# Patient Record
Sex: Male | Born: 1950 | Race: White | Hispanic: No | Marital: Married | Smoking: Never smoker
Health system: Southern US, Community
[De-identification: ages and names within clinical notes are randomized; demographics above are authoritative.]

## PROBLEM LIST (undated history)

## (undated) DIAGNOSIS — I2510 Atherosclerotic heart disease of native coronary artery without angina pectoris: Secondary | ICD-10-CM

## (undated) DIAGNOSIS — R7301 Impaired fasting glucose: Secondary | ICD-10-CM

## (undated) DIAGNOSIS — E785 Hyperlipidemia, unspecified: Secondary | ICD-10-CM

## (undated) HISTORY — DX: Hyperlipidemia, unspecified: E78.5

## (undated) HISTORY — DX: Atherosclerotic heart disease of native coronary artery without angina pectoris: I25.10

## (undated) HISTORY — DX: Impaired fasting glucose: R73.01

---

## 1997-11-12 HISTORY — PX: APPENDECTOMY: SHX54

## 2002-11-12 HISTORY — PX: CARDIAC CATHETERIZATION: SHX172

## 2014-01-29 HISTORY — PX: CORONARY ARTERY BYPASS GRAFT: SHX141

## 2015-10-12 DIAGNOSIS — Z951 Presence of aortocoronary bypass graft: Secondary | ICD-10-CM | POA: Insufficient documentation

## 2015-10-12 DIAGNOSIS — R7301 Impaired fasting glucose: Secondary | ICD-10-CM | POA: Insufficient documentation

## 2015-10-12 DIAGNOSIS — E785 Hyperlipidemia, unspecified: Secondary | ICD-10-CM

## 2015-10-12 HISTORY — DX: Impaired fasting glucose: R73.01

## 2015-10-12 HISTORY — DX: Hyperlipidemia, unspecified: E78.5

## 2015-10-14 ENCOUNTER — Encounter: Payer: Self-pay | Admitting: Cardiovascular Disease

## 2015-10-14 ENCOUNTER — Ambulatory Visit (INDEPENDENT_AMBULATORY_CARE_PROVIDER_SITE_OTHER): Payer: Managed Care, Other (non HMO) | Admitting: Cardiovascular Disease

## 2015-10-14 VITALS — BP 118/82 | HR 66 | Ht 69.0 in | Wt 210.8 lb

## 2015-10-14 DIAGNOSIS — I2510 Atherosclerotic heart disease of native coronary artery without angina pectoris: Secondary | ICD-10-CM | POA: Diagnosis not present

## 2015-10-14 DIAGNOSIS — E785 Hyperlipidemia, unspecified: Secondary | ICD-10-CM | POA: Diagnosis not present

## 2015-10-14 DIAGNOSIS — Z951 Presence of aortocoronary bypass graft: Secondary | ICD-10-CM | POA: Diagnosis not present

## 2015-10-14 MED ORDER — SIMVASTATIN 20 MG PO TABS: 20.0000 mg | ORAL_TABLET | Freq: Every day | ORAL | Status: DC

## 2015-10-14 MED ORDER — CARVEDILOL 3.125 MG PO TABS: 3.1250 mg | ORAL_TABLET | Freq: Two times a day (BID) | ORAL | Status: DC

## 2015-10-14 NOTE — Patient Instructions (Signed)

## 2015-10-14 NOTE — Progress Notes (Signed)
Cardiology Office Note   Date:  10/14/2015   ID:  Steve Craig, DOB [birth year], MRN [id number]  PCP:  Neldon Labella, MD  Cardiologist:   Vesta Mixer, MD   Chief Complaint  Patient presents with  . Coronary Artery Disease   Problem List 1. CAD - CABG 2. Hyperlipidemia    History of Present Illness: Steve Craig is a 64 y.o. male who presents for CAD Hx of CABG January 24, 2014.   Feels great .  Has tried Atorvastatin in the past - did not work as well as he would had hoped. Eats a fairly good diet    Works as a Hospital doctor for [employer]   Works out several times a week .  Worked as an Charity fundraiser Non smoker, occasional ETOH.   Past Medical History  Diagnosis Date  . Hyperlipidemia   . Coronary artery disease   . Elevated fasting glucose 10/12/2015  . Hyperlipemia 10/12/2015    Past Surgical History  Procedure Laterality Date  . Coronary artery bypass graft  01/29/2014  . Cardiac catheterization  2004  . Appendectomy  1999     Current Outpatient Prescriptions  Medication Sig Dispense Refill  . aspirin 81 MG tablet Take 81 mg by mouth daily.    . carvedilol (COREG) 3.125 MG tablet Take 3.125 mg by mouth 2 (two) times daily with a meal.    . simvastatin (ZOCOR) 20 MG tablet Take 20 mg by mouth daily.     No current facility-administered medications for this visit.    Allergies:   Metoprolol succinate er and Morphine sulfate    Social History:  The patient  reports that he has never smoked. He does not have any smokeless tobacco history on file. He reports that he drinks alcohol. He reports that he does not use illicit drugs.   Family History:  The patient's family history includes Breast cancer in his mother; Coronary artery disease in his father; Diabetes Mellitus I in his brother.    ROS:  Please see the history of present illness.    Review of Systems: Constitutional:  denies fever, chills, diaphoresis, appetite change and fatigue.  HEENT:  denies photophobia, eye pain, redness, hearing loss, ear pain, congestion, sore throat, rhinorrhea, sneezing, neck pain, neck stiffness and tinnitus.  Respiratory: denies SOB, DOE, cough, chest tightness, and wheezing.  Cardiovascular: denies chest pain, palpitations and leg swelling.  Gastrointestinal: denies nausea, vomiting, abdominal pain, diarrhea, constipation, blood in stool.  Genitourinary: denies dysuria, urgency, frequency, hematuria, flank pain and difficulty urinating.  Musculoskeletal: denies  myalgias, back pain, joint swelling, arthralgias and gait problem.   Skin: denies pallor, rash and wound.  Neurological: denies dizziness, seizures, syncope, weakness, light-headedness, numbness and headaches.   Hematological: denies adenopathy, easy bruising, personal or family bleeding history.  Psychiatric/ Behavioral: denies suicidal ideation, mood changes, confusion, nervousness, sleep disturbance and agitation.       All other systems are reviewed and negative.    PHYSICAL EXAM: VS:  BP 118/82 mmHg  Pulse 66  Ht  (1.753 m)  Wt 210 lb 12.8 oz (95.618 kg)  BMI 31.12 kg/m2 , BMI Body mass index is 31.12 kg/(m^2). GEN: Well nourished, well developed, in no acute distress HEENT: normal Neck: no JVD, carotid bruits, or masses Cardiac: RRR; no murmurs, rubs, or gallops,no edema  Respiratory:  clear to auscultation bilaterally, normal work of breathing GI: soft, nontender, nondistended, + BS MS: no deformity or atrophy Skin: warm and dry,  no rash Neuro:  Strength and sensation are intact Psych: normal   EKG:  EKG is ordered today. The ekg ordered today demonstrates  NSR at 66.  Normal ECG    Recent Labs: No results found for requested labs within last 365 days.    Lipid Panel No results found for: CHOL, TRIG, HDL, CHOLHDL, VLDL, LDLCALC, LDLDIRECT    Wt Readings from Last 3 Encounters:  10/14/15 210 lb 12.8 oz (95.618 kg)      Other studies  Reviewed: Additional studies/ records that were reviewed today include: . Review of the above records demonstrates:    ASSESSMENT AND PLAN:  1.  CAD - s/p CABG in March, 2015.   No angina.   Very active.    2. Hyperlipidemia:  Continue simvastatin  His last LDL was 86.  We discussed changing to crestor next year.   I'll see him in 1 year for OV and fasting labs.     Current medicines are reviewed at length with the patient today.  The patient does not have concerns regarding medicines.  The following changes have been made:  no change  Labs/ tests ordered today include:  No orders of the defined types were placed in this encounter.     Disposition:   FU with me in 1 year.      Tiarra Anastacio, Deloris PingPhilip J, MD  10/14/2015 10:06 AM    [facility] [address], [place], [place]  [address] Phone: [phone](336) [phone]; Fax: [phone]   [facility]  [address] [place], [place]  [address] [phone]   Fax [phone]) [phone]

## 2016-02-04 ENCOUNTER — Other Ambulatory Visit: Payer: Self-pay | Admitting: Cardiovascular Disease

## 2016-03-05 ENCOUNTER — Encounter: Payer: Self-pay | Admitting: Cardiovascular Disease

## 2016-03-05 ENCOUNTER — Telehealth: Payer: Self-pay | Admitting: Cardiovascular Disease

## 2016-03-05 NOTE — Telephone Encounter (Signed)
New Message  Pt returned call. Checked with the cost of Crestor found that it was more expensive than Simvastatin will stay simvastatin at this time. Please call back to discuss

## 2016-03-05 NOTE — Telephone Encounter (Signed)
New message    Pt is calling to ask if Dr.Nasher going to change is medication lastendastaten to crestor?

## 2016-03-05 NOTE — Telephone Encounter (Signed)
error 

## 2016-03-05 NOTE — Telephone Encounter (Signed)
Spoke with patient who states at last office visit Dr. Elease HashimotoNahser advised that he would like to change him from simvastatin to rosuvastatin when it became generic.  I advised him that he should call his pharmacy to determine the cost and call me back to let me know if he would like us to send Rx. He verbalized understanding and agreement.

## 2016-03-05 NOTE — Telephone Encounter (Signed)
Left detailed message for patient that I got his message and that he can call back prior to next office visit if he decides to switch to rosuvastatin.

## 2016-03-16 ENCOUNTER — Telehealth: Payer: Self-pay | Admitting: Cardiovascular Disease

## 2016-03-16 NOTE — Telephone Encounter (Signed)
Will route to Dr. Elease HashimotoNahser to review and advisement.

## 2016-03-16 NOTE — Telephone Encounter (Signed)
Request for surgical clearance:  1. What type of surgery is being performed? RT Foot surgery  When is this surgery scheduled? 03/29/16  2. Are there any medications that need to be held prior to surgery and how long? unknown  3. Name of physician performing surgery? Dr.N'Tuma Jah  4. What is your office phone and fax number?  [phone]                 Fax   [phone]

## 2016-03-18 NOTE — Telephone Encounter (Signed)
Mr. Steve Craig is at low risk for foot surgery

## 2016-03-19 NOTE — Telephone Encounter (Signed)
I will fax this note to Dr Fanny DanceJah.

## 2016-04-06 ENCOUNTER — Ambulatory Visit: Payer: Managed Care, Other (non HMO) | Admitting: Cardiovascular Disease

## 2016-08-06 ENCOUNTER — Telehealth: Payer: Self-pay | Admitting: Cardiovascular Disease

## 2016-08-06 DIAGNOSIS — E785 Hyperlipidemia, unspecified: Secondary | ICD-10-CM

## 2016-08-06 NOTE — Telephone Encounter (Signed)
°  FYI  [facility]. Current orders expire 12/4.

## 2016-08-06 NOTE — Telephone Encounter (Signed)
Added future CMET and Lipid Profile for 10/22/16.

## 2016-08-06 NOTE — Telephone Encounter (Signed)
Returned pt's call. No answer, no voicemail.

## 2016-10-17 ENCOUNTER — Encounter: Payer: Self-pay | Admitting: Cardiovascular Disease

## 2016-10-22 ENCOUNTER — Ambulatory Visit: Payer: Managed Care, Other (non HMO) | Admitting: Cardiovascular Disease

## 2016-10-22 ENCOUNTER — Other Ambulatory Visit: Payer: Managed Care, Other (non HMO)

## 2016-10-22 ENCOUNTER — Encounter: Payer: Self-pay | Admitting: Cardiovascular Disease

## 2016-10-22 ENCOUNTER — Ambulatory Visit (INDEPENDENT_AMBULATORY_CARE_PROVIDER_SITE_OTHER): Payer: Managed Care, Other (non HMO) | Admitting: Cardiovascular Disease

## 2016-10-22 ENCOUNTER — Encounter (INDEPENDENT_AMBULATORY_CARE_PROVIDER_SITE_OTHER): Payer: Self-pay

## 2016-10-22 VITALS — BP 100/78 | HR 69 | Ht 69.0 in | Wt 204.8 lb

## 2016-10-22 DIAGNOSIS — Z951 Presence of aortocoronary bypass graft: Secondary | ICD-10-CM

## 2016-10-22 DIAGNOSIS — E782 Mixed hyperlipidemia: Secondary | ICD-10-CM | POA: Diagnosis not present

## 2016-10-22 DIAGNOSIS — I2510 Atherosclerotic heart disease of native coronary artery without angina pectoris: Secondary | ICD-10-CM

## 2016-10-22 LAB — COMPREHENSIVE METABOLIC PANEL
ALT: 60 U/L — ABNORMAL HIGH (ref 9–46)
AST: 44 U/L — AB (ref 10–35)
Albumin: 4.4 g/dL (ref 3.6–5.1)
Alkaline Phosphatase: 41 U/L (ref 40–115)
BILIRUBIN TOTAL: 1.1 mg/dL (ref 0.2–1.2)
BUN: 18 mg/dL (ref 7–25)
CO2: 21 mmol/L (ref 20–31)
CREATININE: 1.32 mg/dL — AB (ref 0.70–1.25)
Calcium: 8.8 mg/dL (ref 8.6–10.3)
Chloride: 104 mmol/L (ref 98–110)
GLUCOSE: 97 mg/dL (ref 65–99)
Potassium: 3.9 mmol/L (ref 3.5–5.3)
SODIUM: 138 mmol/L (ref 135–146)
Total Protein: 6.4 g/dL (ref 6.1–8.1)

## 2016-10-22 LAB — LIPID PANEL
Cholesterol: 126 mg/dL (ref <200)
HDL: 31 mg/dL — ABNORMAL LOW (ref >40)
LDL CALC: 71 mg/dL (ref <100)
Total CHOL/HDL Ratio: 4.1 Ratio (ref <5.0)
Triglycerides: 121 mg/dL (ref <150)
VLDL: 24 mg/dL (ref <30)

## 2016-10-22 NOTE — Patient Instructions (Signed)
Medication Instructions:  Your physician recommends that you continue on your current medications as directed. Please refer to the Current Medication list given to you today.   Labwork: TODAY - complete metabolic panel, cholesterol   Testing/Procedures: None Ordered   Follow-Up: Your physician wants you to follow-up in: 1 year with Dr. Nahser. You will receive a reminder letter in the mail two months in advance. If you don't receive a letter, please call our office to schedule the follow-up appointment.   If you need a refill on your cardiac medications before your next appointment, please call your pharmacy.   Thank you for choosing [facility]! Michelle Swinyer, RN [phone]    

## 2016-10-22 NOTE — Progress Notes (Signed)
Cardiology Office Note   Date:  10/22/2016   ID:  Steve Craig, DOB 03/07/1951, MRN [id number]  PCP:  Neldon LabellaMILLER,LISA LYNN, MD  Cardiologist:   Kristeen MissPhilip Anasha Perfecto, MD   Chief Complaint  Patient presents with  . Coronary Artery Disease   Problem List 1. CAD - CABG 2. Hyperlipidemia    History of Present Illness: Steve Craig is a 65 y.o. male who presents for CAD Hx of CABG January 24, 2014.   Feels great .  Has tried Atorvastatin in the past - did not work as well as he would had hoped. 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   Works out several times a week .  Worked as an Charity fundraiserN Non smoker, occasional ETOH.   Dec. 11, 2017:    Is dehydrated from a GI bug this weekend.  No CP or dyspnea Still pilot instructing for [employer].     Past Medical History:  Diagnosis Date  . Coronary artery disease   . Elevated fasting glucose 10/12/2015  . Hyperlipemia 10/12/2015  . Hyperlipidemia     Past Surgical History:  Procedure Laterality Date  . APPENDECTOMY  1999  . CARDIAC CATHETERIZATION  2004  . CORONARY ARTERY BYPASS GRAFT  01/29/2014     Current Outpatient Prescriptions  Medication Sig Dispense Refill  . aspirin 81 MG tablet Take 81 mg by mouth daily.    . carvedilol (COREG) 3.125 MG tablet Take 1 tablet (3.125 mg total) by mouth 2 (two) times daily with a meal. 180 tablet 3  . simvastatin (ZOCOR) 20 MG tablet Take 1 tablet (20 mg total) by mouth daily. 90 tablet 3   No current facility-administered medications for this visit.     Allergies:   Metoprolol succinate er and Morphine sulfate    Social History:  The patient  reports that he has never smoked. He has never used smokeless tobacco. He reports that he drinks alcohol. He reports that he does not use drugs.   Family History:  The patient's family history includes Breast cancer in his mother; Coronary artery disease in his father; Diabetes Mellitus I in his brother.    ROS:   Please see the history of present illness.    Review of Systems: Constitutional:  denies fever, chills, diaphoresis, appetite change and fatigue.  HEENT: denies photophobia, eye pain, redness, hearing loss, ear pain, congestion, sore throat, rhinorrhea, sneezing, neck pain, neck stiffness and tinnitus.  Respiratory: denies SOB, DOE, cough, chest tightness, and wheezing.  Cardiovascular: denies chest pain, palpitations and leg swelling.  Gastrointestinal: denies nausea, vomiting, abdominal pain, diarrhea, constipation, blood in stool.  Genitourinary: denies dysuria, urgency, frequency, hematuria, flank pain and difficulty urinating.  Musculoskeletal: denies  myalgias, back pain, joint swelling, arthralgias and gait problem.   Skin: denies pallor, rash and wound.  Neurological: denies dizziness, seizures, syncope, weakness, light-headedness, numbness and headaches.   Hematological: denies adenopathy, easy bruising, personal or family bleeding history.  Psychiatric/ Behavioral: denies suicidal ideation, mood changes, confusion, nervousness, sleep disturbance and agitation.       All other systems are reviewed and negative.    PHYSICAL EXAM: VS:  BP 100/78 (BP Location: Left Arm, Patient Position: Sitting, Cuff Size: Normal)   Pulse 69   Ht 5\' 9"  (1.753 m)   Wt 204 lb 12.8 oz (92.9 kg)   BMI 30.24 kg/m  , BMI Body mass index is 30.24 kg/m. GEN: Well nourished, well developed, in  no acute distress  HEENT: normal  Neck: no JVD, carotid bruits, or masses Cardiac: RRR; no murmurs, rubs, or gallops,no edema  Respiratory:  clear to auscultation bilaterally, normal work of breathing GI: soft, nontender, nondistended, + BS MS: no deformity or atrophy  Skin: warm and dry, no rash Neuro:  Strength and sensation are intact Psych: normal   EKG:  EKG is ordered today. The ekg ordered today demonstrates  NSR at 69.  Normal ECG    Recent Labs: No results found for requested labs within  last 8760 hours.    Lipid Panel No results found for: CHOL, TRIG, HDL, CHOLHDL, VLDL, LDLCALC, LDLDIRECT    Wt Readings from Last 3 Encounters:  10/22/16 204 lb 12.8 oz (92.9 kg)  10/14/15 210 lb 12.8 oz (95.6 kg)      Other studies Reviewed: Additional studies/ records that were reviewed today include: . Review of the above records demonstrates:    ASSESSMENT AND PLAN:  1.  CAD - s/p CABG in March, 2015.   No angina.   Very active.    2. Hyperlipidemia:  Continue simvastatin  His last LDL was 86.  We discussed changing to crestor next year.   I'll see him in 1 year for OV and fasting labs.     Current medicines are reviewed at length with the patient today.  The patient does not have concerns regarding medicines.  The following changes have been made:  no change  Labs/ tests ordered today include:  No orders of the defined types were placed in this encounter.    Disposition:   FU with me in 1 year.      Kristeen MissPhilip Ommie Degeorge, MD  10/22/2016 10:38 AM    [facility] [address], [place], [place]  [address] Phone: [phone](336) [phone]; Fax: [phone]

## 2016-10-24 ENCOUNTER — Telehealth: Payer: Self-pay | Admitting: Cardiovascular Disease

## 2016-10-24 DIAGNOSIS — E782 Mixed hyperlipidemia: Secondary | ICD-10-CM

## 2016-10-24 MED ORDER — ROSUVASTATIN CALCIUM 5 MG PO TABS: 5.0000 mg | ORAL_TABLET | Freq: Every day | ORAL | 11 refills | Status: DC

## 2016-10-24 NOTE — Telephone Encounter (Signed)
New message   Pt verbalized that he is returning call from rn

## 2016-10-24 NOTE — Telephone Encounter (Signed)
Spoke with patient and reviewed lab results and plan of care. Patient verbalized understanding and agreement to stop simvastatin and start rosuvastatin 5 mg daily.  He is scheduled for repeat lab work on 3/15.  I advised him to call back sooner with questions or concerns.  He verbalized understanding and agreement and thanked me for the call.

## 2016-10-26 ENCOUNTER — Other Ambulatory Visit: Payer: Self-pay | Admitting: *Deleted

## 2016-10-26 ENCOUNTER — Telehealth: Payer: Self-pay | Admitting: Cardiovascular Disease

## 2016-10-26 MED ORDER — CARVEDILOL 3.125 MG PO TABS
3.1250 mg | ORAL_TABLET | Freq: Two times a day (BID) | ORAL | 3 refills | Status: DC
Start: 2016-10-26 — End: 2017-12-06

## 2016-10-26 NOTE — Telephone Encounter (Signed)
Medication Detail    Disp Refills Start End   carvedilol (COREG) 3.125 MG tablet 180 tablet 3 10/26/2016    Sig - Route: Take 1 tablet (3.125 mg total) by mouth 2 (two) times daily with a meal. - Oral   E-Prescribing Status: Receipt confirmed by pharmacy (10/26/2016 9:47 AM EST)   Pharmacy   [id number] [place], [place] - [address]

## 2016-10-26 NOTE — Telephone Encounter (Signed)
°*  STAT* If patient is at the pharmacy, call can be transferred to refill team.   1. Which medications need to be refilled? (please list name of each medication and dose if known) coreig 3.125mg  2. Which pharmacy/location (including street and city if local pharmacy) is medication to be sent to? [facility] [address]  3. Do they need a 30 day or 90 day supply? 90

## 2016-12-18 ENCOUNTER — Other Ambulatory Visit: Payer: Self-pay | Admitting: Sports Medicine

## 2016-12-18 DIAGNOSIS — M25562 Pain in left knee: Principal | ICD-10-CM

## 2016-12-18 DIAGNOSIS — G8929 Other chronic pain: Secondary | ICD-10-CM

## 2016-12-20 ENCOUNTER — Other Ambulatory Visit: Payer: Self-pay | Admitting: *Deleted

## 2016-12-20 MED ORDER — ROSUVASTATIN CALCIUM 5 MG PO TABS: 5.0000 mg | ORAL_TABLET | Freq: Every day | ORAL | 3 refills | Status: DC

## 2016-12-20 NOTE — Telephone Encounter (Signed)
Rx changed to ninety day per request.

## 2016-12-21 ENCOUNTER — Other Ambulatory Visit: Payer: Self-pay | Admitting: *Deleted

## 2016-12-21 MED ORDER — ROSUVASTATIN CALCIUM 5 MG PO TABS: 5.0000 mg | ORAL_TABLET | Freq: Every day | ORAL | 3 refills | Status: DC

## 2016-12-24 ENCOUNTER — Ambulatory Visit
Admission: RE | Admit: 2016-12-24 | Discharge: 2016-12-24 | Disposition: A | Discharge status: Home / Self Care | Payer: Managed Care, Other (non HMO) | Source: Ambulatory Visit | Attending: Sports Medicine | Admitting: Sports Medicine

## 2016-12-24 DIAGNOSIS — M25562 Pain in left knee: Principal | ICD-10-CM

## 2016-12-24 DIAGNOSIS — G8929 Other chronic pain: Secondary | ICD-10-CM

## 2017-01-23 ENCOUNTER — Other Ambulatory Visit: Payer: Managed Care, Other (non HMO) | Admitting: *Deleted

## 2017-01-23 DIAGNOSIS — E782 Mixed hyperlipidemia: Secondary | ICD-10-CM

## 2017-01-23 LAB — LIPID PANEL
CHOL/HDL RATIO: 4.3 ratio (ref 0.0–5.0)
Cholesterol, Total: 158 mg/dL (ref 100–199)
HDL: 37 mg/dL — AB (ref >39)
LDL CALC: 90 mg/dL (ref 0–99)
Triglycerides: 157 mg/dL — ABNORMAL HIGH (ref 0–149)
VLDL Cholesterol Cal: 31 mg/dL (ref 5–40)

## 2017-01-23 LAB — COMPREHENSIVE METABOLIC PANEL
A/G RATIO: 1.9 (ref 1.2–2.2)
ALK PHOS: 58 IU/L (ref 39–117)
ALT: 60 IU/L — ABNORMAL HIGH (ref 0–44)
AST: 41 IU/L — AB (ref 0–40)
Albumin: 4.4 g/dL (ref 3.6–4.8)
BUN/Creatinine Ratio: 9 — ABNORMAL LOW (ref 10–24)
BUN: 12 mg/dL (ref 8–27)
Bilirubin Total: 0.7 mg/dL (ref 0.0–1.2)
CALCIUM: 9.2 mg/dL (ref 8.6–10.2)
CO2: 21 mmol/L (ref 18–29)
Chloride: 96 mmol/L (ref 96–106)
Creatinine, Ser: 1.29 mg/dL — ABNORMAL HIGH (ref 0.76–1.27)
GFR calc Af Amer: 67 mL/min/{1.73_m2} (ref >59)
GFR, EST NON AFRICAN AMERICAN: 58 mL/min/{1.73_m2} — AB (ref >59)
GLOBULIN, TOTAL: 2.3 g/dL (ref 1.5–4.5)
Glucose: 104 mg/dL — ABNORMAL HIGH (ref 65–99)
POTASSIUM: 4 mmol/L (ref 3.5–5.2)
Sodium: 138 mmol/L (ref 134–144)
Total Protein: 6.7 g/dL (ref 6.0–8.5)

## 2017-01-23 NOTE — Addendum Note (Signed)
Addended by: Tonita PhoenixBOWDEN, Lakelyn Straus K on: 01/23/2017 09:14 AM   Modules accepted: Orders

## 2017-01-24 ENCOUNTER — Telehealth: Payer: Self-pay | Admitting: Nurse Practitioner

## 2017-01-24 ENCOUNTER — Other Ambulatory Visit: Payer: Managed Care, Other (non HMO)

## 2017-01-24 DIAGNOSIS — E782 Mixed hyperlipidemia: Secondary | ICD-10-CM

## 2017-01-24 MED ORDER — ROSUVASTATIN CALCIUM 20 MG PO TABS: 20.0000 mg | ORAL_TABLET | Freq: Every day | ORAL | 3 refills | Status: DC

## 2017-01-24 NOTE — Telephone Encounter (Signed)
-----   Message from Vesta MixerPhilip J Nahser, MD sent at 01/23/2017  5:04 PM EDT ----- Correction: The patient is on Crestor 5 mg a day  Please increase the crestor to 20 mg a day  Check labs in 3 months

## 2017-01-24 NOTE — Telephone Encounter (Signed)
Lab results reviewed with patient. He verbalized understanding and agreement to increase rosuvastatin to 20 mg daily. I scheduled him for repeat 3 month lab appointment on 6/19.

## 2017-04-30 ENCOUNTER — Other Ambulatory Visit: Payer: Managed Care, Other (non HMO)

## 2017-05-31 ENCOUNTER — Other Ambulatory Visit: Payer: Managed Care, Other (non HMO) | Admitting: *Deleted

## 2017-05-31 ENCOUNTER — Encounter (INDEPENDENT_AMBULATORY_CARE_PROVIDER_SITE_OTHER): Payer: Self-pay

## 2017-05-31 DIAGNOSIS — E782 Mixed hyperlipidemia: Secondary | ICD-10-CM

## 2017-05-31 LAB — LIPID PANEL
CHOLESTEROL TOTAL: 115 mg/dL (ref 100–199)
Chol/HDL Ratio: 3.7 ratio (ref 0.0–5.0)
HDL: 31 mg/dL — AB (ref >39)
LDL Calculated: 56 mg/dL (ref 0–99)
TRIGLYCERIDES: 141 mg/dL (ref 0–149)
VLDL CHOLESTEROL CAL: 28 mg/dL (ref 5–40)

## 2017-05-31 LAB — HEPATIC FUNCTION PANEL
ALT: 36 IU/L (ref 0–44)
AST: 27 IU/L (ref 0–40)
Albumin: 4.2 g/dL (ref 3.6–4.8)
Alkaline Phosphatase: 56 IU/L (ref 39–117)
Bilirubin Total: 0.6 mg/dL (ref 0.0–1.2)
Bilirubin, Direct: 0.18 mg/dL (ref 0.00–0.40)
Total Protein: 6.2 g/dL (ref 6.0–8.5)

## 2017-06-05 ENCOUNTER — Telehealth: Payer: Self-pay

## 2017-06-05 NOTE — Telephone Encounter (Signed)
Spoke with patient and advised him to take the Crestor 20 mg every other day to see if symptoms improve. I advised him to call back in 1 month to let us know how he is doing. He verbalized understanding and agreement and thanked me for the call.

## 2017-06-05 NOTE — Telephone Encounter (Signed)
Called patient to report lab results who verbalized understanding. Patient stated that he have had increase of muscle aches since increase of Crestor. Wanted to know if ok to decrease dosage? Routing to Dr. Elease HashimotoNahser for advice.

## 2017-06-10 NOTE — Telephone Encounter (Signed)
Agree with reducing the dose. Will see if Crestor 20 mg QOD allows the muscle aches to resolve

## 2017-07-11 ENCOUNTER — Telehealth: Payer: Self-pay | Admitting: Cardiovascular Disease

## 2017-07-11 DIAGNOSIS — E782 Mixed hyperlipidemia: Secondary | ICD-10-CM

## 2017-07-11 NOTE — Telephone Encounter (Signed)
F/u Message  Pt returning call to f/u on update of medication .please call back to discuss

## 2017-07-11 NOTE — Telephone Encounter (Signed)
Pt informing Dr. Elease HashimotoNahser that taking Crestor QOD is working great.  No issues to report on this regimen. He will continue taking it QOD. Will forward to [employer] and his nurse for their [facility]

## 2017-07-11 NOTE — Telephone Encounter (Signed)
That sound good  Will recheck labs when I see him at his next visit

## 2017-07-12 MED ORDER — ROSUVASTATIN CALCIUM 20 MG PO TABS: 20.0000 mg | ORAL_TABLET | ORAL | 3 refills | Status: DC

## 2017-07-12 NOTE — Telephone Encounter (Signed)
Changed patient's medication list to reflect that he is taking Rosuvastatin 20 mg every other day. I left a message for him to call back to schedule return ov with fasting labs for December.

## 2017-10-11 ENCOUNTER — Other Ambulatory Visit: Payer: Managed Care, Other (non HMO) | Admitting: *Deleted

## 2017-10-11 DIAGNOSIS — E782 Mixed hyperlipidemia: Secondary | ICD-10-CM

## 2017-10-11 LAB — LIPID PANEL
Chol/HDL Ratio: 4.6 ratio (ref 0.0–5.0)
Cholesterol, Total: 156 mg/dL (ref 100–199)
HDL: 34 mg/dL — AB (ref >39)
LDL Calculated: 87 mg/dL (ref 0–99)
TRIGLYCERIDES: 176 mg/dL — AB (ref 0–149)
VLDL Cholesterol Cal: 35 mg/dL (ref 5–40)

## 2017-10-11 LAB — HEPATIC FUNCTION PANEL
ALBUMIN: 4.7 g/dL (ref 3.6–4.8)
ALT: 48 IU/L — ABNORMAL HIGH (ref 0–44)
AST: 31 IU/L (ref 0–40)
Alkaline Phosphatase: 59 IU/L (ref 39–117)
Bilirubin Total: 0.8 mg/dL (ref 0.0–1.2)
Bilirubin, Direct: 0.2 mg/dL (ref 0.00–0.40)
TOTAL PROTEIN: 6.8 g/dL (ref 6.0–8.5)

## 2017-10-22 ENCOUNTER — Ambulatory Visit: Payer: Managed Care, Other (non HMO) | Admitting: Cardiovascular Disease

## 2017-10-29 ENCOUNTER — Telehealth: Payer: Self-pay | Admitting: Cardiovascular Disease

## 2017-10-29 NOTE — Telephone Encounter (Signed)
New Message   Patient calling back from voicemail he received in reference to lab results. Please call back.

## 2017-10-29 NOTE — Telephone Encounter (Signed)
Attempted to call patient voicemail not set up.

## 2017-10-29 NOTE — Telephone Encounter (Signed)
Reviewed lab results and plan of care with patient who verbalized understanding. He thanked me for the call.

## 2017-12-06 ENCOUNTER — Other Ambulatory Visit: Payer: Self-pay | Admitting: Cardiovascular Disease

## 2017-12-25 ENCOUNTER — Ambulatory Visit: Payer: Managed Care, Other (non HMO) | Admitting: Cardiovascular Disease

## 2017-12-25 ENCOUNTER — Encounter (INDEPENDENT_AMBULATORY_CARE_PROVIDER_SITE_OTHER): Payer: Self-pay

## 2017-12-25 ENCOUNTER — Encounter: Payer: Self-pay | Admitting: Cardiovascular Disease

## 2017-12-25 VITALS — BP 114/78 | HR 71 | Ht 69.0 in | Wt 207.4 lb

## 2017-12-25 DIAGNOSIS — I2510 Atherosclerotic heart disease of native coronary artery without angina pectoris: Secondary | ICD-10-CM

## 2017-12-25 DIAGNOSIS — E782 Mixed hyperlipidemia: Secondary | ICD-10-CM | POA: Diagnosis not present

## 2017-12-25 MED ORDER — CARVEDILOL 3.125 MG PO TABS: 3.1250 mg | ORAL_TABLET | Freq: Two times a day (BID) | ORAL | 0 refills | Status: DC

## 2017-12-25 MED ORDER — ROSUVASTATIN CALCIUM 20 MG PO TABS: 20.0000 mg | ORAL_TABLET | ORAL | 3 refills | Status: DC

## 2017-12-25 MED ORDER — CARVEDILOL 3.125 MG PO TABS: 3.1250 mg | ORAL_TABLET | Freq: Two times a day (BID) | ORAL | 3 refills | Status: AC

## 2017-12-25 NOTE — Progress Notes (Signed)
Cardiology Office Note   Date:  12/25/2017   ID:  Steve Craig, DOB 06-May-1951, MRN [id number]  PCP:  Sigmund Hazel, MD  Cardiologist:   Kristeen Miss, MD   Chief Complaint  Patient presents with  . Coronary Artery Disease   Problem List 1. CAD - CABG 2. Hyperlipidemia     Steve Craig is a 67 y.o. male who presents for CAD Hx of CABG January 24, 2014.   Feels great .  Has tried Atorvastatin in the past - did not work as well as he would had hoped. Eats a fairly good diet    Works as a Hospital doctor for [employer]   Works out several times a week .  Worked as an Charity fundraiser Non smoker, occasional ETOH.   Dec. 11, 2017:    Is dehydrated from a GI bug this weekend.  No CP or dyspnea Still pilot instructing for [employer]     December 25, 2017  Steve Craig  is seen back today for follow-up of his coronary artery disease and coronary artery bypass grafting.  He has a history of hyperlipidemia.  Still exerercising regularly , no CP or dyspnea  Crestor 20 mg QOD - no muscle aches with this dosing  Is going to retire in 140 days Is moving up to [place] this summer .   Past Medical History:  Diagnosis Date  . Coronary artery disease   . Elevated fasting glucose 10/12/2015  . Hyperlipemia 10/12/2015  . Hyperlipidemia     Past Surgical History:  Procedure Laterality Date  . APPENDECTOMY  1999  . CARDIAC CATHETERIZATION  2004  . CORONARY ARTERY BYPASS GRAFT  01/29/2014     Current Outpatient Medications  Medication Sig Dispense Refill  . aspirin 81 MG tablet Take 81 mg by mouth daily.    . carvedilol (COREG) 3.125 MG tablet TAKE 1 TABLET (3.125 MG TOTAL) BY MOUTH 2 (TWO) TIMES DAILY WITH A MEAL. 180 tablet 0  . rosuvastatin (CRESTOR) 20 MG tablet Take 1 tablet (20 mg total) by mouth every other day. 45 tablet 3   No current facility-administered medications for this visit.     Allergies:   Metoprolol tartrate and Morphine sulfate    Social History:  The patient   reports that  has never smoked. he has never used smokeless tobacco. He reports that he drinks alcohol. He reports that he does not use drugs.   Family History:  The patient's family history includes Breast cancer in his mother; Coronary artery disease in his father; Diabetes Mellitus I in his brother.    ROS:   Noted in current history, all other systems are negative.  Physical Exam: Blood pressure 114/78, pulse 71, height 5\' 9"  (1.753 m), weight 207 lb 6.4 oz (94.1 kg).  GEN:  Well nourished, well developed in no acute distress HEENT: Normal NECK: No JVD; No carotid bruits LYMPHATICS: No lymphadenopathy CARDIAC: RR, no murmurs, rubs, gallops RESPIRATORY:  Clear to auscultation without rales, wheezing or rhonchi  ABDOMEN: Soft, non-tender, non-distended MUSCULOSKELETAL:  No edema; No deformity  SKIN: Warm and dry NEUROLOGIC:  Alert and oriented x 3   EKG:    Feb. 13, 2019:  NSR at 71. NSR    Recent Labs: 01/23/2017: BUN 12; Creatinine, Ser 1.29; Potassium 4.0; Sodium 138 10/11/2017: ALT 48    Lipid Panel    Component Value Date/Time   CHOL 156 10/11/2017 0926   TRIG 176 (H) 10/11/2017 0926   HDL 34 (L)  10/11/2017 0926   CHOLHDL 4.6 10/11/2017 0926   CHOLHDL 4.1 10/22/2016 1111   VLDL 24 10/22/2016 1111   LDLCALC 87 10/11/2017 0926      Wt Readings from Last 3 Encounters:  12/25/17 207 lb 6.4 oz (94.1 kg)  10/22/16 204 lb 12.8 oz (92.9 kg)  10/14/15 210 lb 12.8 oz (95.6 kg)      Other studies Reviewed: Additional studies/ records that were reviewed today include: . Review of the above records demonstrates:    ASSESSMENT AND PLAN:  1.  CAD - s/p CABG in March, 2015.  Doing well, no angina.  2. Hyperlipidemia: Continue Crestor 20 mg every other day.  He seems to tolerate this fairly well.  We will check fasting labs next week.  He is planning on retiring this summer.  He will be moving up to [place] later this summer.  We will go ahead and set up a  3433-month office visit and he will counseled that if he is already moved to [place].   Current medicines are reviewed at length with the patient today.  The patient does not have concerns regarding medicines.  The following changes have been made:  no change  Labs/ tests ordered today include:  No orders of the defined types were placed in this encounter.    Disposition:   FU with me in 1 year.      Kristeen MissPhilip Nahser, MD  12/25/2017 10:11 AM    [facility] [address], [place], [place]  [address] Phone: [phone](336) [phone]; Fax: [phone]

## 2017-12-25 NOTE — Patient Instructions (Signed)
Medication Instructions:  Your physician recommends that you continue on your current medications as directed. Please refer to the Current Medication list given to you today.   Labwork: Your physician recommends that you return for lab work in: 1 week for fasting lab work   Testing/Procedures: None Ordered   Follow-Up: Your physician wants you to follow-up in: 6 months with Dr. Elease HashimotoNahser.  You will receive a reminder letter in the mail two months in advance. If you don't receive a letter, please call our office to schedule the follow-up appointment.   If you need a refill on your cardiac medications before your next appointment, please call your pharmacy.   Thank you for choosing [facility]! Eligha BridegroomMichelle Khole Branch, RN [phone]

## 2018-01-03 ENCOUNTER — Other Ambulatory Visit: Payer: Managed Care, Other (non HMO) | Admitting: *Deleted

## 2018-01-03 DIAGNOSIS — I2510 Atherosclerotic heart disease of native coronary artery without angina pectoris: Secondary | ICD-10-CM

## 2018-01-03 DIAGNOSIS — E782 Mixed hyperlipidemia: Secondary | ICD-10-CM

## 2018-01-03 LAB — LIPID PANEL
Chol/HDL Ratio: 3.9 ratio (ref 0.0–5.0)
Cholesterol, Total: 138 mg/dL (ref 100–199)
HDL: 35 mg/dL — ABNORMAL LOW (ref >39)
LDL CALC: 72 mg/dL (ref 0–99)
Triglycerides: 155 mg/dL — ABNORMAL HIGH (ref 0–149)
VLDL CHOLESTEROL CAL: 31 mg/dL (ref 5–40)

## 2018-01-03 LAB — HEPATIC FUNCTION PANEL
ALBUMIN: 4.6 g/dL (ref 3.6–4.8)
ALT: 62 IU/L — ABNORMAL HIGH (ref 0–44)
AST: 39 IU/L (ref 0–40)
Alkaline Phosphatase: 57 IU/L (ref 39–117)
BILIRUBIN TOTAL: 0.8 mg/dL (ref 0.0–1.2)
Bilirubin, Direct: 0.2 mg/dL (ref 0.00–0.40)
Total Protein: 6.7 g/dL (ref 6.0–8.5)

## 2018-01-03 LAB — BASIC METABOLIC PANEL
BUN/Creatinine Ratio: 11 (ref 10–24)
BUN: 15 mg/dL (ref 8–27)
CO2: 24 mmol/L (ref 20–29)
CREATININE: 1.34 mg/dL — AB (ref 0.76–1.27)
Calcium: 9.3 mg/dL (ref 8.6–10.2)
Chloride: 102 mmol/L (ref 96–106)
GFR calc Af Amer: 63 mL/min/{1.73_m2} (ref >59)
GFR calc non Af Amer: 55 mL/min/{1.73_m2} — ABNORMAL LOW (ref >59)
GLUCOSE: 106 mg/dL — AB (ref 65–99)
POTASSIUM: 4.4 mmol/L (ref 3.5–5.2)
SODIUM: 140 mmol/L (ref 134–144)

## 2018-01-07 ENCOUNTER — Telehealth: Payer: Self-pay | Admitting: Nurse Practitioner

## 2018-01-07 DIAGNOSIS — I2510 Atherosclerotic heart disease of native coronary artery without angina pectoris: Secondary | ICD-10-CM

## 2018-01-07 DIAGNOSIS — R7401 Elevation of levels of liver transaminase levels: Secondary | ICD-10-CM

## 2018-01-07 DIAGNOSIS — R740 Nonspecific elevation of levels of transaminase and lactic acid dehydrogenase [LDH]: Principal | ICD-10-CM

## 2018-01-07 NOTE — Telephone Encounter (Signed)
-----   Message from Philip JVesta Mixer Nahser, MD sent at 01/03/2018  4:22 PM EST ----- Chol, LDL, HDL and Trigs all look better ALT is mildly elevated but not to the point that we would stop the Crestor He is moving up [place] later this summer. He will need to have lipid levels and liver enzymes drawn in 3 months

## 2018-01-07 NOTE — Telephone Encounter (Signed)
Reviewed lab results with patient's wife, Steve Craig, [facility]. She verbalized understanding and states patient will still be in this area in 1 month. I scheduled patient for repeat lab appointment on 5/22 and advised her that they may call back to reschedule if needed. She thanked me for the call.

## 2018-04-02 ENCOUNTER — Encounter (INDEPENDENT_AMBULATORY_CARE_PROVIDER_SITE_OTHER): Payer: Self-pay

## 2018-04-02 ENCOUNTER — Other Ambulatory Visit: Payer: Managed Care, Other (non HMO) | Admitting: *Deleted

## 2018-04-02 ENCOUNTER — Telehealth: Payer: Self-pay | Admitting: Nurse Practitioner

## 2018-04-02 DIAGNOSIS — I2510 Atherosclerotic heart disease of native coronary artery without angina pectoris: Secondary | ICD-10-CM

## 2018-04-02 DIAGNOSIS — R7401 Elevation of levels of liver transaminase levels: Secondary | ICD-10-CM

## 2018-04-02 DIAGNOSIS — R740 Nonspecific elevation of levels of transaminase and lactic acid dehydrogenase [LDH]: Principal | ICD-10-CM

## 2018-04-02 LAB — HEPATIC FUNCTION PANEL
ALK PHOS: 53 IU/L (ref 39–117)
ALT: 74 IU/L — AB (ref 0–44)
AST: 44 IU/L — AB (ref 0–40)
Albumin: 4.6 g/dL (ref 3.6–4.8)
BILIRUBIN, DIRECT: 0.22 mg/dL (ref 0.00–0.40)
Bilirubin Total: 0.8 mg/dL (ref 0.0–1.2)
Total Protein: 6.6 g/dL (ref 6.0–8.5)

## 2018-04-02 MED ORDER — ROSUVASTATIN CALCIUM 10 MG PO TABS: 10.0000 mg | ORAL_TABLET | Freq: Every day | ORAL | 3 refills | Status: DC

## 2018-04-02 MED ORDER — ROSUVASTATIN CALCIUM 10 MG PO TABS: 10.0000 mg | ORAL_TABLET | ORAL | 3 refills | Status: DC

## 2018-04-02 NOTE — Telephone Encounter (Signed)
-----   Message from Vesta Mixer, MD sent at 04/02/2018  4:12 PM EDT ----- It appears that he is taking Crestor 20 mg every other day: His liver enzymes are up slightly but not quite high enough that we would necessarily need to discontinue the Crestor.  I would suggest decreasing Crestor to 10 mg every other day.  He is scheduled to move up to [place] later this summer.  He will need to follow-up with a doctor up there in approximately 2 to 3 months. If he is still in [place] in 2 to 3 months, will recheck lipid levels, liver enzymes, basic metabolic profile.

## 2018-04-02 NOTE — Telephone Encounter (Signed)
Reviewed lab results and plan of care with patient who verbalized understanding. He will decrease Crestor to 10 mg every other day. He made an appointment for September for f/u and lab work and advised he will call back to cancel if he moves to [place] prior to that time. He thanked me for the call.

## 2018-05-31 IMAGING — MR MR KNEE*L* W/O CM
5 of 6 series · 33 of 40 positions shown · non-contrast
Comparison: None.

CLINICAL DATA: Lateral left knee pain since a fall in a garage 1
year ago.

EXAM:
MRI OF THE LEFT KNEE WITHOUT CONTRAST
TECHNIQUE: Multiplanar, multisequence MR imaging of the knee was performed. No
intravenous contrast was administered.

[Series 4: PD · axial · 4.0mm · 0.50mm/px · z∈[-82,+38]mm · 7 of 26 slices shown (1 of 2)]
[im 1/26]
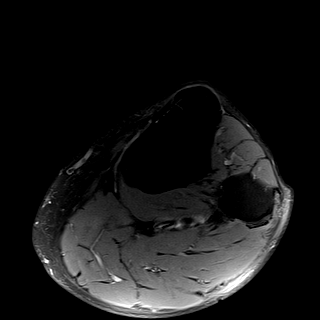
[im 5/26]
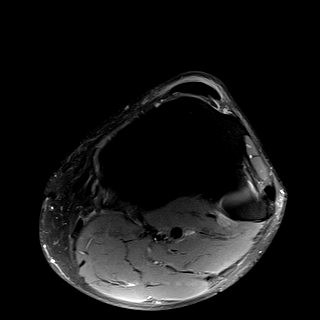
[im 9/26]
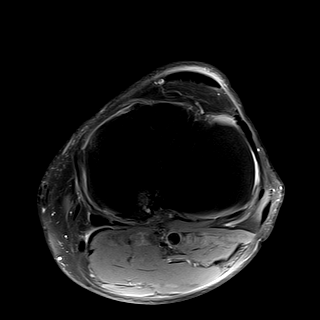
[im 13/26]
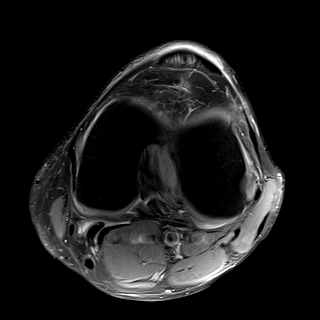
[im 17/26]
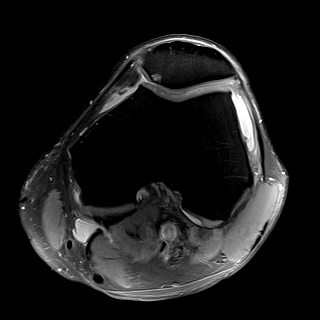
[im 21/26]
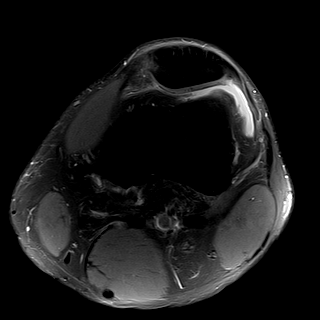
[im 26/26]
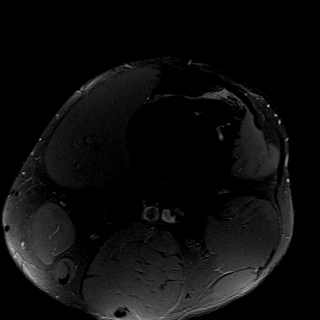

[Series 6: PD fat-sat · sagittal · 4.0mm · 0.42mm/px · 7 of 24 slices shown]
[im 1/24]
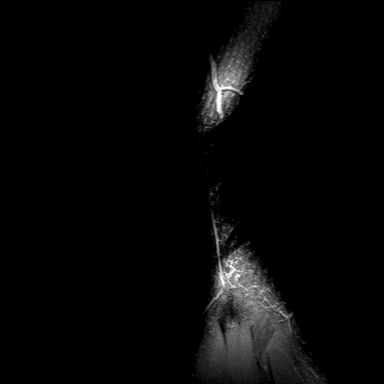
[im 4/24]
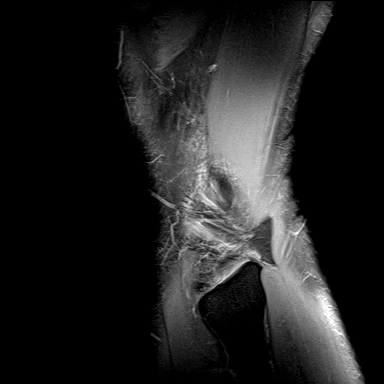
[im 8/24]
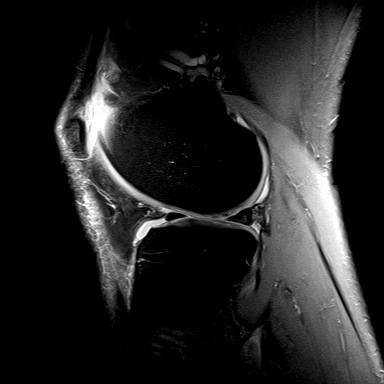
[im 12/24]
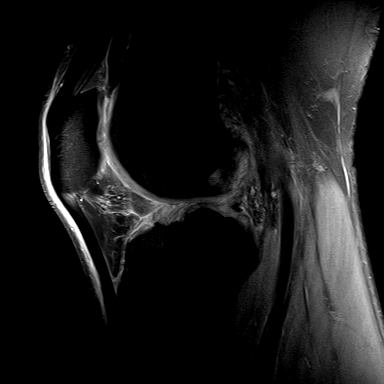
[im 16/24]
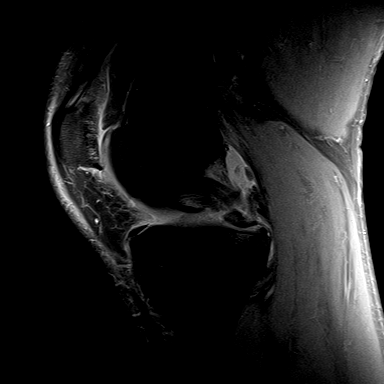
[im 20/24]
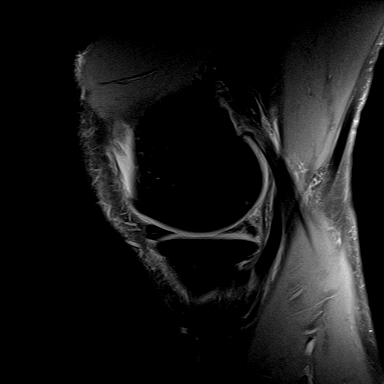
[im 24/24]
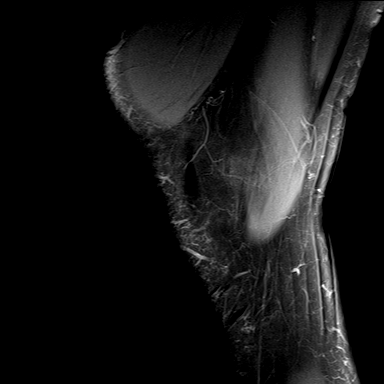

[Series 7: T1 · coronal · 4.0mm · 0.38mm/px · 7 of 22 slices shown]
[im 1/22]
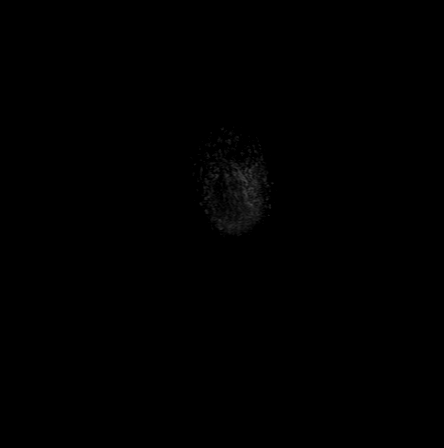
[im 4/22]
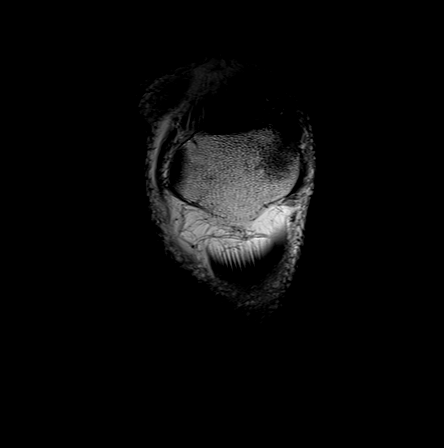
[im 8/22]
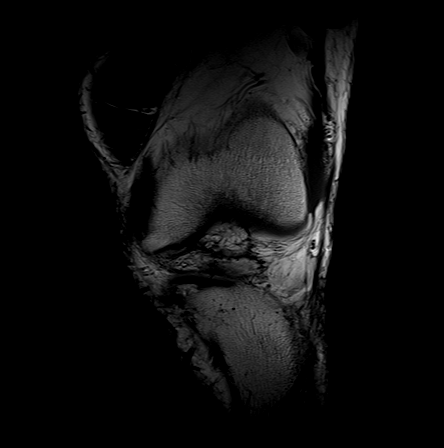
[im 11/22]
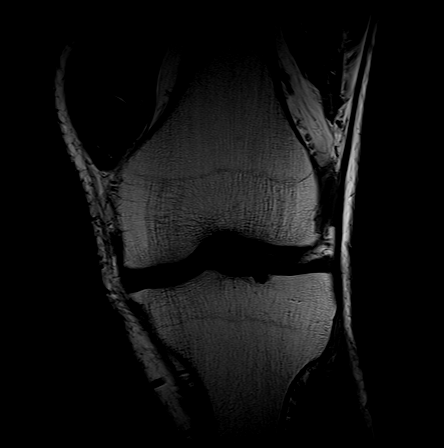
[im 15/22]
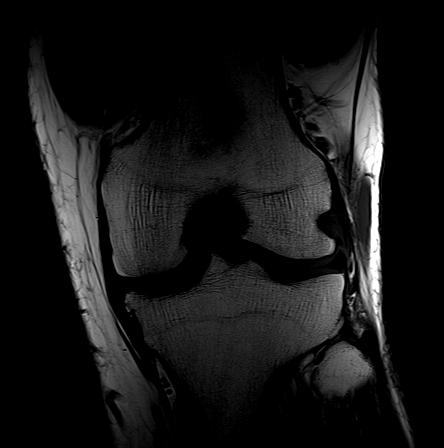
[im 18/22]
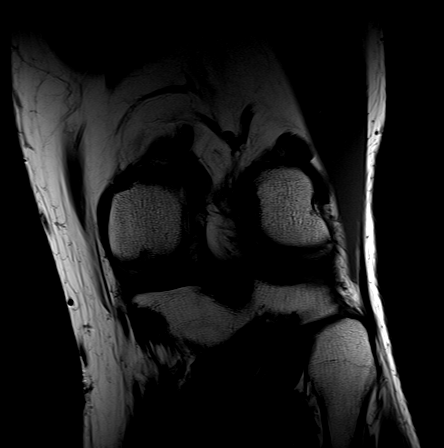
[im 22/22]
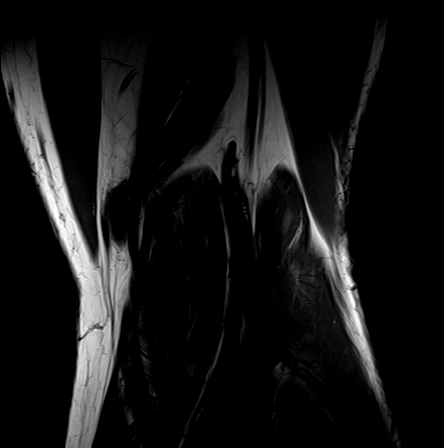

[Series 8: (id) fs · coronal · 4.0mm · 0.53mm/px · 5 of 22 slices shown]
[im 1/22]
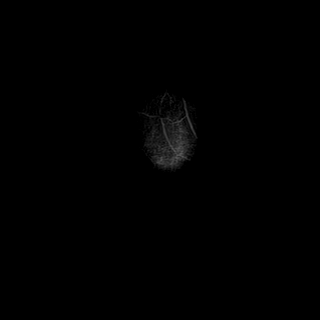
[im 4/22]
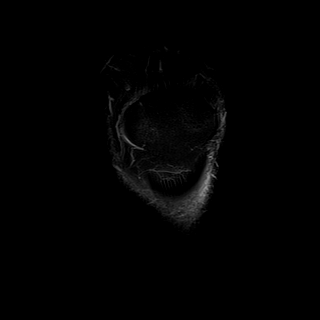
[im 8/22]
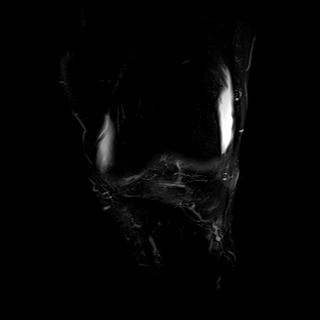
[im 11/22]
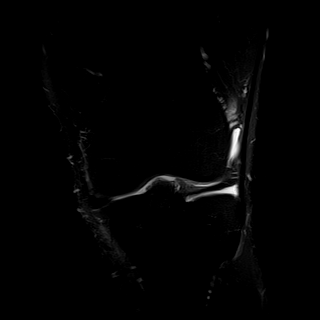
[im 15/22]
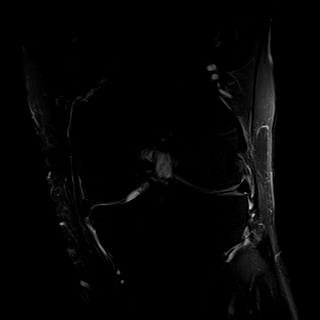

[Series 10: PD · coronal · 4.0mm · 0.38mm/px · 7 of 22 slices shown (2 of 2)]
[im 1/22]
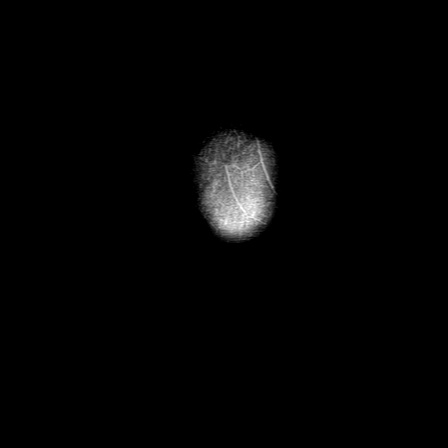
[im 4/22]
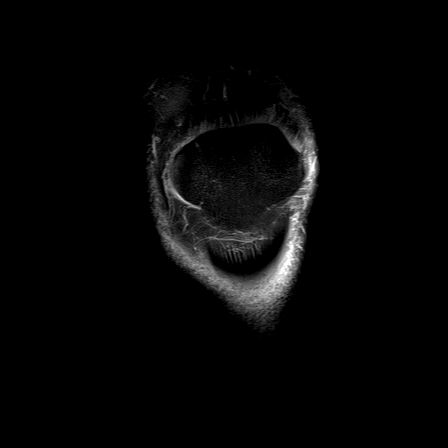
[im 8/22]
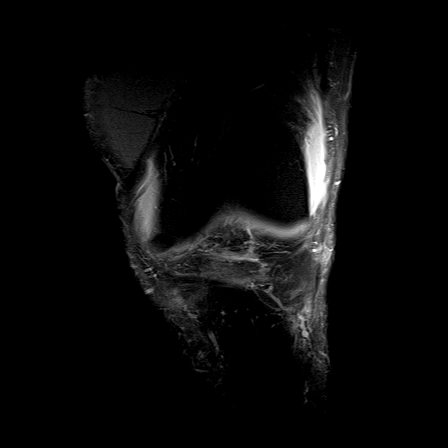
[im 11/22]
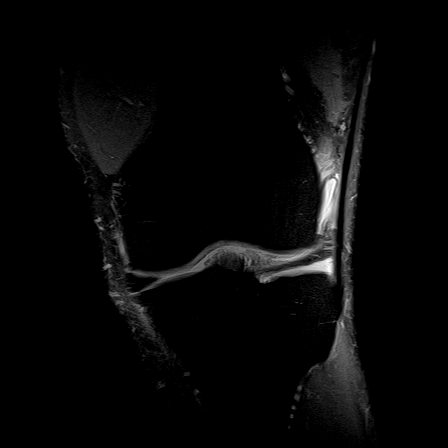
[im 15/22]
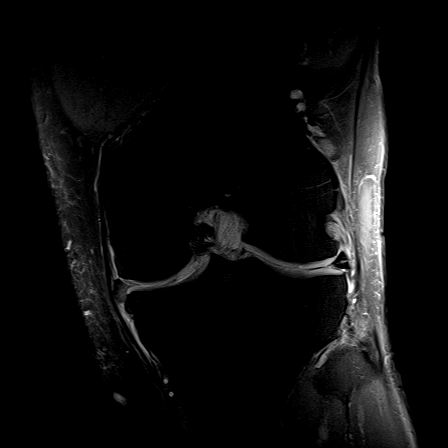
[im 18/22]
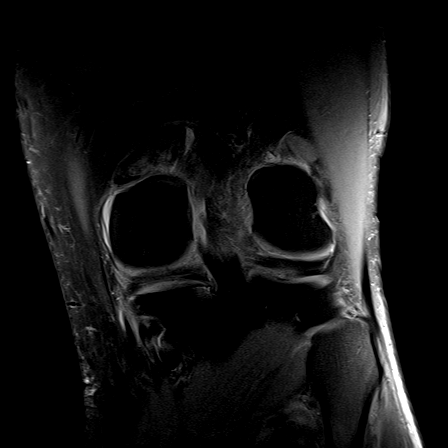
[im 22/22]
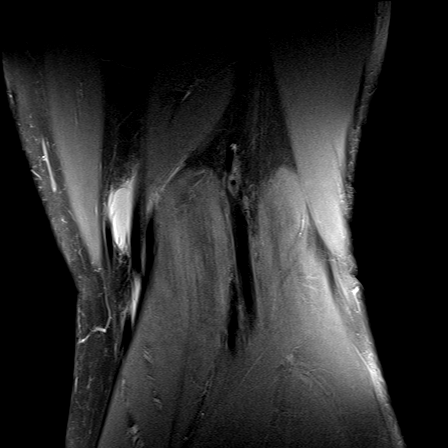

[33 of 40 positions shown; findings below may reference images not displayed]

FINDINGS: MENISCI

Medial meniscus: There is a large horizontal tear in the posterior
horn of the medial meniscus reaching the meniscal undersurface. The
tear extends into the meniscal body. A small flap of meniscus is
seen displaced along the medial tibial plateau in the midbody.

Lateral meniscus: Complex tear is seen in the central aspect of the
posterior horn. Fraying along the free edge of the body is
identified.

LIGAMENTS

Cruciates: Marked mucoid degeneration of the ACL without tear is
identified. PCL appears normal.

Collaterals:  Intact.

CARTILAGE

Patellofemoral: Cartilage loss along the medial patellar facet is
identified with associated small subchondral cysts.

Medial:  Thinned and irregular throughout.

Lateral:  Mildly degenerated.

Joint:  Small joint effusion.

Popliteal Fossa: Small Baker's cyst measures 1.4 cm transverse by
1.2 cm AP by 3.1 cm craniocaudal.

Extensor Mechanism:  Intact.

Bones: No fracture or worrisome lesion. Small osteophytes are
identified about the knee.

Other: None.
IMPRESSION: Tears of both the medial and lateral menisci as described above.

Mild to moderate osteoarthritis appears worst in the medial
compartment.

Marked mucoid degeneration the ACL without tear.

Small Baker's cyst.

## 2018-07-03 ENCOUNTER — Encounter: Payer: Self-pay | Admitting: Cardiovascular Disease

## 2018-07-28 ENCOUNTER — Ambulatory Visit: Payer: Managed Care, Other (non HMO) | Admitting: Cardiovascular Disease

## 2019-05-07 ENCOUNTER — Other Ambulatory Visit: Payer: Self-pay | Admitting: Cardiovascular Disease

## 2020-02-03 ENCOUNTER — Encounter: Payer: Self-pay | Admitting: General Practice
